# Patient Record
Sex: Female | Born: 1935 | Race: White | Hispanic: Yes | Smoking: Never smoker
Health system: Southern US, Community
[De-identification: ages and names within clinical notes are randomized; demographics above are authoritative.]

## PROBLEM LIST (undated history)

## (undated) DIAGNOSIS — Z8709 Personal history of other diseases of the respiratory system: Secondary | ICD-10-CM

## (undated) DIAGNOSIS — Z8781 Personal history of (healed) traumatic fracture: Secondary | ICD-10-CM

## (undated) DIAGNOSIS — R413 Other amnesia: Secondary | ICD-10-CM

## (undated) DIAGNOSIS — IMO0001 Reserved for inherently not codable concepts without codable children: Secondary | ICD-10-CM

## (undated) DIAGNOSIS — R030 Elevated blood-pressure reading, without diagnosis of hypertension: Secondary | ICD-10-CM

## (undated) DIAGNOSIS — IMO0002 Reserved for concepts with insufficient information to code with codable children: Secondary | ICD-10-CM

## (undated) HISTORY — DX: Other amnesia: R41.3

## (undated) HISTORY — DX: Reserved for concepts with insufficient information to code with codable children: IMO0002

## (undated) HISTORY — DX: Personal history of (healed) traumatic fracture: Z87.81

## (undated) HISTORY — PX: VAGINAL HYSTERECTOMY: SUR661

## (undated) HISTORY — DX: Personal history of other diseases of the respiratory system: Z87.09

## (undated) HISTORY — DX: Elevated blood-pressure reading, without diagnosis of hypertension: R03.0

## (undated) HISTORY — PX: BREAST SURGERY: SHX581

## (undated) HISTORY — DX: Reserved for inherently not codable concepts without codable children: IMO0001

---

## 2006-11-04 HISTORY — PX: TOTAL KNEE ARTHROPLASTY: SHX125

## 2010-06-28 ENCOUNTER — Emergency Department (HOSPITAL_BASED_OUTPATIENT_CLINIC_OR_DEPARTMENT_OTHER)
Admission: EM | Admit: 2010-06-28 | Discharge: 2010-06-28 | Payer: Self-pay | Source: Home / Self Care | Admitting: Emergency Medicine

## 2010-06-28 ENCOUNTER — Ambulatory Visit: Payer: Self-pay | Admitting: Diagnostic Radiology

## 2011-04-26 ENCOUNTER — Other Ambulatory Visit: Payer: Self-pay | Admitting: Neurology

## 2011-04-26 DIAGNOSIS — R413 Other amnesia: Secondary | ICD-10-CM

## 2013-09-03 ENCOUNTER — Ambulatory Visit (INDEPENDENT_AMBULATORY_CARE_PROVIDER_SITE_OTHER): Payer: Medicare Other | Admitting: Diagnostic Neuroimaging

## 2013-09-03 ENCOUNTER — Encounter: Payer: Self-pay | Admitting: Diagnostic Neuroimaging

## 2013-09-03 ENCOUNTER — Encounter (INDEPENDENT_AMBULATORY_CARE_PROVIDER_SITE_OTHER): Payer: Self-pay

## 2013-09-03 VITALS — BP 138/83 | HR 94 | Temp 98.2°F | Ht 63.0 in | Wt 124.5 lb

## 2013-09-03 DIAGNOSIS — R269 Unspecified abnormalities of gait and mobility: Secondary | ICD-10-CM

## 2013-09-03 DIAGNOSIS — R413 Other amnesia: Secondary | ICD-10-CM

## 2013-09-03 NOTE — Patient Instructions (Addendum)
I will order labs and MRI.  It is not safe for you to drive anymore. Please find other ways to get transportation (friends, family, taxi).

## 2013-09-03 NOTE — Progress Notes (Signed)
[facility]  PATIENT: Gloria Mcdaniel DOB: 09-04-1927  REFERRING CLINICIAN: Aguilar HISTORY FROM: patient and daughter REASON FOR VISIT: new consult   HISTORICAL  CHIEF COMPLAINT:  Chief Complaint  Patient presents with  . Memory Loss    HISTORY OF PRESENT ILLNESS:   79 year old right-handed female with hypothyroidism, breast cancer, here for evaluation of memory problems. Patient completed by her daughter. Her other daughter is a patient of mine, but not here for this visit.  For past one year patient has had progressive short-term memory problems. He forgets recent conversations and events. She's having more difficulty with driving, getting lost, driving the wrong way down the Street. Patient's daughter is concerned about her ability to drive. Patient herself takes care of her other daughter who has epilepsy.  Patient continues to take care of her activities daily living such as shopping, cooking, cleaning, finances.   REVIEW OF SYSTEMS: Full 14 system review of systems performed and notable only for memory loss confusion joint pain.  ALLERGIES: No Known Allergies  HOME MEDICATIONS: Outpatient Prescriptions Prior to Visit  Medication Sig Dispense Refill  . levothyroxine (SYNTHROID, LEVOTHROID) 25 MCG tablet Take 25 mcg by mouth daily before breakfast.      . oxyCODONE-acetaminophen (PERCOCET/ROXICET) 5-325 MG per tablet Take 1 tablet by mouth every 4 (four) hours as needed for severe pain.      . pregabalin (LYRICA) 50 MG capsule Take 50 mg by mouth 3 (three) times daily.      . ranitidine (ZANTAC) 150 MG tablet Take 150 mg by mouth 2 (two) times daily as needed.       . tamoxifen (NOLVADEX) 20 MG tablet Take 20 mg by mouth daily.      . calcium-vitamin D (OSCAL) 250-125 MG-UNIT per tablet Take 1 tablet by mouth daily.      . niacin 500 MG tablet Take 500 mg by mouth at bedtime.       No facility-administered medications prior to visit.    PAST  MEDICAL HISTORY: Past Medical History  Diagnosis Date  . Aftercare for healing traumatic fracture of other bone   . Genetic counseling     BRCA gnetic testing  . Elevated blood pressure reading without diagnosis of hypertension   . Personal history of other diseases of respiratory system   . Personal history of traumatic fracture   . Personal history of traumatic fracture   . Memory loss     PAST SURGICAL HISTORY: Past Surgical History  Procedure Laterality Date  . Breast surgery Right     Radical Mastectomy 1973-Dr. Joseph Art  . Vaginal hysterectomy    . Total knee arthroplasty Right 11/04/2006    FAMILY HISTORY: Family History  Problem Relation Age of Onset  . Cancer Father   . Seizures Daughter   . Thyroid disease Daughter     SOCIAL HISTORY:  History   Social History  . Marital Status: Widowed    Spouse Name: N/A    Number of Children: 3  . Years of Education: HS   Occupational History  . Retired    Social History Main Topics  . Smoking status: Never Smoker   . Smokeless tobacco: Never Used  . Alcohol Use: No  . Drug Use: No  . Sexual Activity: Not on file   Other Topics Concern  . Not on file   Social History Narrative   Patient lives at home with daughter.   Caffeine Use: 1 cup daily  PHYSICAL EXAM  Filed Vitals:   09/03/13 1105  BP: 138/83  Pulse: 94  Temp: 98.2 F (36.8 C)  TempSrc: Oral  Height: 5\' 3"  (1.6 m)  Weight: 124 lb 8 oz (56.473 kg)    Not recorded    Body mass index is 22.06 kg/(m^2).  GENERAL EXAM: Patient is in no distress; well developed, nourished and groomed; neck is supple  CARDIOVASCULAR: Regular rate and rhythm, no murmurs, no carotid bruits  NEUROLOGIC: MENTAL STATUS: awake, alert, oriented to person, place and time, recent and remote memory intact, normal attention and concentration, language fluent, comprehension intact, naming intact, fund of knowledge appropriate; MMSE 21/30 (WITH USING WORLD BACKWARDS,  SHE SCORES 25/30). NO FRONTAL RELEASE SIGNS. CRANIAL NERVE: no papilledema on fundoscopic exam, pupils equal and reactive to light, visual fields full to confrontation, extraocular muscles intact, no nystagmus, facial sensation and strength symmetric, hearing intact, palate elevates symmetrically, uvula midline, shoulder shrug symmetric, tongue midline. MOTOR: normal bulk and tone, full strength in the BUE, BLE; EXCEPT LIMITED (4/5) IN RLE DUE TO RIGHT KNEE PAIN SENSORY: normal and symmetric to light touch, temperature; ABSENT VIB AT TOES. COORDINATION: finger-nose-finger, fine finger movements normal REFLEXES: deep tendon reflexes present and symmetric GAIT/STATION: SLOW TO RISE; CAUTIOUS GAIT; STOOPED POSTURE. UNSTEADY, SLOW GAIT. CANNOT TANDEM, TOE OR HEEL. ROMBERG NEG.    DIAGNOSTIC DATA (LABS, IMAGING, TESTING) - I reviewed patient records, labs, notes, testing and imaging myself where available.  No results found for this basename: WBC, HGB, HCT, MCV, PLT   No results found for this basename: na, k, cl, co2, glucose, bun, creatinine, calcium, prot, albumin, ast, alt, alkphos, bilitot, gfrnonaa, gfraa   No results found for this basename: CHOL, HDL, LDLCALC, LDLDIRECT, TRIG, CHOLHDL   No results found for this basename: HGBA1C   No results found for this basename: VITAMINB12   No results found for this basename: TSH     ASSESSMENT AND PLAN  79 y.o. year old female here with memory loss x 1 year. Mainly short term. Poor insight. Continues to drive, although daughters are afraid of what has been happening (getting lost, wrong way on 1 way).  Ddx: mild dementia, MCI, metabolic, structural, vascular  PLAN: - [place] brain [place]) - labs (ask PCP to check B12, TSH) - stop driving (has been getting lost; went wrong way on 1 way street; crossed over median) - home health evaluation (memory loss, gait diff, balance) - patient and family need to assess long  term living situation and safety of living independently   Orders Placed This Encounter  Procedures  . MR Brain Wo Contrast  . Home Health  . Face-to-face encounter (required for Medicare/Medicaid patients)    Return in about 3 months (around 12/02/2013) for with Heide Guile or Penumalli.    Suanne Marker, MD 09/03/2013, 1:05 PM Certified in Neurology, Neurophysiology and Neuroimaging  [facility] [address], [address] [place], [place] [address] [phone]

## 2013-09-11 ENCOUNTER — Ambulatory Visit (HOSPITAL_BASED_OUTPATIENT_CLINIC_OR_DEPARTMENT_OTHER)
Admission: RE | Admit: 2013-09-11 | Discharge: 2013-09-11 | Disposition: A | Discharge status: Home / Self Care | Payer: Medicare Other | Source: Ambulatory Visit | Attending: Diagnostic Neuroimaging | Admitting: Diagnostic Neuroimaging

## 2013-09-11 DIAGNOSIS — R413 Other amnesia: Secondary | ICD-10-CM

## 2013-09-11 DIAGNOSIS — R269 Unspecified abnormalities of gait and mobility: Secondary | ICD-10-CM

## 2013-09-11 DIAGNOSIS — G319 Degenerative disease of nervous system, unspecified: Secondary | ICD-10-CM | POA: Insufficient documentation

## 2013-09-20 ENCOUNTER — Telehealth: Payer: Self-pay | Admitting: Diagnostic Neuroimaging

## 2013-09-20 NOTE — Telephone Encounter (Signed)
Please advise, patient's daughter wants the MRI results.

## 2013-09-20 NOTE — Telephone Encounter (Signed)
Patient's daughter calling to state that patient has not heard anything about her MRI results. Please call the patient's daughter.

## 2013-09-21 ENCOUNTER — Telehealth: Payer: Self-pay | Admitting: Diagnostic Neuroimaging

## 2013-09-21 NOTE — Telephone Encounter (Signed)
Patient's daughter called x2 requesting results of MRI done on 09/11/13. Please call to advise.

## 2013-09-27 NOTE — Telephone Encounter (Signed)
Patient's daughter calling again for MRI results. Patient's daughter has called multiple times, please call the patient.

## 2013-09-27 NOTE — Telephone Encounter (Signed)
Please advise 

## 2013-09-27 NOTE — Telephone Encounter (Signed)
I called pts daughter and gave MRI results. Dx for patient is MCI vs mild dementia. Will need more supervision. Pt needs to stop driving.  Will proceed with home health referral with social work assistance. Order already placed at last visit.   May consider donepezil in next 1-2 months as well.  Suanne MarkerVIKRAM R. PENUMALLI, MD 09/27/2013, 5:48 PM Certified in Neurology, Neurophysiology and Neuroimaging  [facility] [address], [address] [place], [place] [address] [phone]

## 2013-09-28 NOTE — Telephone Encounter (Signed)
Home health referral 

## 2013-10-19 ENCOUNTER — Other Ambulatory Visit: Payer: Self-pay | Admitting: Diagnostic Neuroimaging

## 2013-10-19 MED ORDER — DONEPEZIL HCL 5 MG PO TABS: 5.0000 mg | ORAL_TABLET | Freq: Every day | ORAL | Status: DC

## 2013-12-15 ENCOUNTER — Ambulatory Visit: Payer: PRIVATE HEALTH INSURANCE | Admitting: Diagnostic Neuroimaging

## 2014-11-01 ENCOUNTER — Encounter: Payer: Self-pay | Admitting: Diagnostic Neuroimaging

## 2014-11-01 ENCOUNTER — Ambulatory Visit (INDEPENDENT_AMBULATORY_CARE_PROVIDER_SITE_OTHER): Payer: Medicare Other | Admitting: Diagnostic Neuroimaging

## 2014-11-01 VITALS — BP 140/77 | HR 101 | Ht 61.0 in | Wt 151.2 lb

## 2014-11-01 DIAGNOSIS — M25511 Pain in right shoulder: Secondary | ICD-10-CM

## 2014-11-01 DIAGNOSIS — F03B0 Unspecified dementia, moderate, without behavioral disturbance, psychotic disturbance, mood disturbance, and anxiety: Secondary | ICD-10-CM

## 2014-11-01 DIAGNOSIS — H53461 Homonymous bilateral field defects, right side: Secondary | ICD-10-CM

## 2014-11-01 DIAGNOSIS — F0390 Unspecified dementia without behavioral disturbance: Secondary | ICD-10-CM

## 2014-11-01 MED ORDER — MEMANTINE HCL 10 MG PO TABS: 10.0000 mg | ORAL_TABLET | Freq: Two times a day (BID) | ORAL | Status: DC

## 2014-11-01 NOTE — Progress Notes (Signed)
[facility]  PATIENT: Gloria Mcdaniel DOB: 09/12/1927  REFERRING CLINICIAN: Aguilar HISTORY FROM: patient and daughter REASON FOR VISIT: follow up   HISTORICAL  CHIEF COMPLAINT:  Chief Complaint  Patient presents with  . Follow-up    memory loss; had a stroke per eye Dr. about a year ago    HISTORY OF PRESENT ILLNESS:   [place] 11/01/14: Since last visit, memory loss has continued. In Jan 2015, patient's daughter took over finances. In Feb 2015, she fell and broke left hip. Then transitioned to rehab then [facility] independent living. Also, in Jan 2016 she went to eye doctor, found to have incidental RIGHT homonymous hemianopsia. Patient doesn't recall any focal neuro deficits.   PRIOR HPI (09/03/13): 81 year old right-handed female with hypothyroidism, breast cancer, here for evaluation of memory problems. Patient completed by her daughter. Her other daughter is a patient of mine, but not here for this visit. For past one year patient has had progressive short-term memory problems. He forgets recent conversations and events. She's having more difficulty with driving, getting lost, driving the wrong way down the Street. Patient's daughter is concerned about her ability to drive. Patient herself takes care of her other daughter who has epilepsy. Patient continues to take care of her activities daily living such as shopping, cooking, cleaning, finances.   REVIEW OF SYSTEMS: Full 14 system review of systems performed and notable only for freq urination joint pain joint swelling aching muscles walking diff memory loss daytime sleepiness.   ALLERGIES: No Known Allergies  HOME MEDICATIONS: Outpatient Prescriptions Prior to Visit  Medication Sig Dispense Refill  . levothyroxine (SYNTHROID, LEVOTHROID) 25 MCG tablet Take 25 mcg by mouth daily before breakfast.    . tamoxifen (NOLVADEX) 20 MG tablet Take 20 mg by mouth daily.    . Diclofenac Sodium (VOLTAREN EX)  Apply 1 application topically daily.    Marland Kitchen donepezil (ARICEPT) 5 MG tablet Take 1 tablet (5 mg total) by mouth at bedtime. 90 tablet 4  . oxyCODONE-acetaminophen (PERCOCET/ROXICET) 5-325 MG per tablet Take 1 tablet by mouth every 4 (four) hours as needed for severe pain.    . pregabalin (LYRICA) 50 MG capsule Take 50 mg by mouth 3 (three) times daily.    . ranitidine (ZANTAC) 150 MG tablet Take 150 mg by mouth 2 (two) times daily as needed.      No facility-administered medications prior to visit.    PAST MEDICAL HISTORY: Past Medical History  Diagnosis Date  . Aftercare for healing traumatic fracture of other bone   . Genetic counseling     BRCA gnetic testing  . Elevated blood pressure reading without diagnosis of hypertension   . Personal history of other diseases of respiratory system   . Personal history of traumatic fracture   . Personal history of traumatic fracture   . Memory loss     PAST SURGICAL HISTORY: Past Surgical History  Procedure Laterality Date  . Breast surgery Right     Radical Mastectomy 1973-Dr. Sherral Hammers  . Vaginal hysterectomy    . Total knee arthroplasty Right 11/04/2006    FAMILY HISTORY: Family History  Problem Relation Age of Onset  . Cancer Father   . Seizures Daughter   . Thyroid disease Daughter     SOCIAL HISTORY:  History   Social History  . Marital Status: Widowed    Spouse Name: N/A    Number of Children: 3  . Years of Education: HS   Occupational History  .  Retired    Social History Main Topics  . Smoking status: Never Smoker   . Smokeless tobacco: Never Used  . Alcohol Use: No  . Drug Use: No  . Sexual Activity: Not on file   Other Topics Concern  . Not on file   Social History Narrative   Patient lives at home with daughter as [facility]   Caffeine Use: 1 cup daily     PHYSICAL EXAM  Filed Vitals:   11/01/14 1325  BP: 140/77  Pulse: 101  Height: $Remove'5\' 1"'kilffjW$  (1.549 m)  Weight: 151 lb 3.2 oz  (68.584 kg)    Not recorded     MMSE - Mini Mental State Exam 11/01/2014  Orientation to time 1  Orientation to Place 3  Registration 3  Attention/ Calculation 1  Recall 0  Language- name 2 objects 2  Language- repeat 1  Language- follow 3 step command 3  Language- read & follow direction 0  Write a sentence 1  Copy design 1  Total score 16     Body mass index is 28.58 kg/(m^2).  GENERAL EXAM: Patient is in no distress; well developed, nourished and groomed; neck is supple  CARDIOVASCULAR: Regular rate and rhythm, no murmurs, no carotid bruits  NEUROLOGIC: MENTAL STATUS: awake, alert, language fluent, comprehension intact, naming intact, fund of knowledge appropriate; MMSE16/30; NO FRONTAL RELEASE SIGNS. CRANIAL NERVE: no papilledema on fundoscopic exam, pupils equal and reactive to light, RIGHT HOMONYMOUS HEMIANOPSIA.  extraocular muscles intact, no nystagmus, facial sensation and strength symmetric, hearing intact, palate elevates symmetrically, uvula midline, shoulder shrug symmetric, tongue midline. MOTOR: normal bulk and tone, full strength in the BUE, BLE; EXCEPT LIMITED (4/5) IN RLE DUE TO RIGHT KNEE PAIN AND RUE PROX  DUE TO RIGHT SHOULDER PAIN SENSORY: normal and symmetric to light touch, temperature; ABSENT VIB AT TOES. COORDINATION: finger-nose-finger, fine finger movements normal REFLEXES: deep tendon reflexes present and symmetric GAIT/STATION: SLOW TO RISE; CAUTIOUS GAIT; STOOPED POSTURE. UNSTEADY, SLOW GAIT. USES WALKER.     DIAGNOSTIC DATA (LABS, IMAGING, TESTING) - I reviewed patient records, labs, notes, testing and imaging myself where available.  No results found for: WBC No results found for: NA No results found for: CHOL No results found for: HGBA1C No results found for: VITAMINB12 No results found for: TSH   ASSESSMENT AND PLAN  81 y.o. year old female here with memory loss since 2014, mainly short term with poor insight. Now with new right  visual field cut and progression of memory loss.  Dx: moderate dementia   PLAN: - MRI brain (eval for left PCA stroke or other cause of vision change); then may consider stroke workup and risk factor reduction pending MRI results - add memantine $RemoveBefor'10mg'qiJygmCPCcin$  BID to donepezil - PT eval for right shoulder pain  Orders Placed This Encounter  Procedures  . MR Brain Wo Contrast  . Ambulatory referral to Physical Therapy   Meds ordered this encounter  Medications  . memantine (NAMENDA) 10 MG tablet    Sig: Take 1 tablet (10 mg total) by mouth 2 (two) times daily.    Dispense:  60 tablet    Refill:  12   Return in about 4 months (around 03/02/2015).    Penni Bombard, MD 5/0/9326, 7:12 PM Certified in Neurology, Neurophysiology and Neuroimaging  [facility] [address] [place], [place] [address] [phone]

## 2014-11-01 NOTE — Patient Instructions (Signed)
I will check MRI brain.   Start memantine 10mg  at bedtime x 1 week; then increase to twice a day.

## 2014-11-16 ENCOUNTER — Inpatient Hospital Stay: Admission: RE | Admit: 2014-11-16 | Payer: Medicare Other | Source: Ambulatory Visit

## 2014-11-28 ENCOUNTER — Ambulatory Visit
Admission: RE | Admit: 2014-11-28 | Discharge: 2014-11-28 | Disposition: A | Discharge status: Home / Self Care | Payer: Medicare Other | Source: Ambulatory Visit | Attending: Diagnostic Neuroimaging | Admitting: Diagnostic Neuroimaging

## 2014-11-28 DIAGNOSIS — H53461 Homonymous bilateral field defects, right side: Secondary | ICD-10-CM

## 2014-11-28 DIAGNOSIS — F0390 Unspecified dementia without behavioral disturbance: Secondary | ICD-10-CM

## 2014-11-28 DIAGNOSIS — M25511 Pain in right shoulder: Secondary | ICD-10-CM

## 2014-11-28 DIAGNOSIS — F03B0 Unspecified dementia, moderate, without behavioral disturbance, psychotic disturbance, mood disturbance, and anxiety: Secondary | ICD-10-CM

## 2014-12-07 ENCOUNTER — Telehealth: Payer: Self-pay | Admitting: Diagnostic Neuroimaging

## 2014-12-07 NOTE — Telephone Encounter (Signed)
Patient's daughter is calling to get MRI results for patient. Please call.

## 2014-12-12 ENCOUNTER — Telehealth: Payer: Self-pay | Admitting: Diagnostic Neuroimaging

## 2014-12-12 DIAGNOSIS — I63532 Cerebral infarction due to unspecified occlusion or stenosis of left posterior cerebral artery: Secondary | ICD-10-CM

## 2014-12-12 NOTE — Telephone Encounter (Signed)
Patient's daughter is calling to get the results of her Mother's MRI.  Please call.

## 2014-12-16 NOTE — Telephone Encounter (Signed)
I called pts daughter and indicated that pt may have had hemorrhagic infarct in Jan 2016 or earlier. Will repeat CT head in April to ensure stability/resolution. No other further aggressive work up / interventions at this time due to advanced age, dementia and medical co-morbidities.   Suanne MarkerVIKRAM R. PENUMALLI, MD 12/16/2014, 4:45 PM Certified in Neurology, Neurophysiology and Neuroimaging  [facility] [address], [address] [place], [place] [address] [phone]

## 2014-12-28 ENCOUNTER — Ambulatory Visit
Admission: RE | Admit: 2014-12-28 | Discharge: 2014-12-28 | Disposition: A | Discharge status: Home / Self Care | Payer: Medicare Other | Source: Ambulatory Visit | Attending: Diagnostic Neuroimaging | Admitting: Diagnostic Neuroimaging

## 2014-12-28 DIAGNOSIS — I63532 Cerebral infarction due to unspecified occlusion or stenosis of left posterior cerebral artery: Secondary | ICD-10-CM | POA: Diagnosis not present

## 2015-01-04 ENCOUNTER — Telehealth: Payer: Self-pay | Admitting: Diagnostic Neuroimaging

## 2015-01-04 NOTE — Telephone Encounter (Signed)
Patient's daughter Earley AbideHilda requesting CT scan results.  Please call and advise.

## 2015-01-04 NOTE — Telephone Encounter (Signed)
Called and spoke with the daughter and explained, per Dr. Richrd HumblesPenumalli's instructions, about the head CT. Dr. Demetrius CharityP asked me to tell the daughter that the CT showed that the hemorrhage was resolving at this time and that nothing else was an issue. The daughter was very pleased and stated a thanks and an understanding.

## 2015-01-25 ENCOUNTER — Other Ambulatory Visit: Payer: Self-pay | Admitting: Diagnostic Neuroimaging

## 2015-02-28 ENCOUNTER — Ambulatory Visit: Payer: Medicare Other | Admitting: Diagnostic Neuroimaging

## 2015-03-15 ENCOUNTER — Encounter: Payer: Self-pay | Admitting: Diagnostic Neuroimaging

## 2015-03-15 ENCOUNTER — Ambulatory Visit (INDEPENDENT_AMBULATORY_CARE_PROVIDER_SITE_OTHER): Payer: Medicare Other | Admitting: Diagnostic Neuroimaging

## 2015-03-15 VITALS — BP 107/64 | HR 88 | Wt 154.3 lb

## 2015-03-15 DIAGNOSIS — I63532 Cerebral infarction due to unspecified occlusion or stenosis of left posterior cerebral artery: Secondary | ICD-10-CM

## 2015-03-15 DIAGNOSIS — F0390 Unspecified dementia without behavioral disturbance: Secondary | ICD-10-CM | POA: Diagnosis not present

## 2015-03-15 DIAGNOSIS — F03B0 Unspecified dementia, moderate, without behavioral disturbance, psychotic disturbance, mood disturbance, and anxiety: Secondary | ICD-10-CM

## 2015-03-15 MED ORDER — DONEPEZIL HCL 10 MG PO TABS: 10.0000 mg | ORAL_TABLET | Freq: Every day | ORAL | Status: DC

## 2015-03-15 MED ORDER — MEMANTINE HCL 10 MG PO TABS: 10.0000 mg | ORAL_TABLET | Freq: Two times a day (BID) | ORAL | Status: DC

## 2015-03-15 NOTE — Patient Instructions (Signed)
Continue current medications. 

## 2015-03-15 NOTE — Progress Notes (Signed)
[facility]  PATIENT: Gloria Mcdaniel DOB: May 06, 1927  REFERRING CLINICIAN: Aguilar HISTORY FROM: patient and daughter REASON FOR VISIT: follow up   HISTORICAL  CHIEF COMPLAINT:  Chief Complaint  Patient presents with  . Dementia    rm 7, daughter  . Memory Loss    MMSE 20  . Follow-up    HISTORY OF PRESENT ILLNESS:   UPDATE 03/15/15: Since last visit, doing well. No new events. Feels good. Memory stable. MRI and CT head results reviewed again.  UPDATE 11/01/14: Since last visit, memory loss has continued. In Jan 2015, patient's daughter took over finances. In Feb 2015, she fell and broke left hip. Then transitioned to rehab then [facility]. Also, in Jan 2016 she went to eye doctor, found to have incidental RIGHT homonymous hemianopsia. Patient doesn't recall any focal neuro deficits.   PRIOR HPI (09/03/13): 81 year old right-handed female with hypothyroidism, breast cancer, here for evaluation of memory problems. Patient completed by her daughter. Her other daughter is a patient of mine, but not here for this visit. For past one year patient has had progressive short-term memory problems. He forgets recent conversations and events. She's having more difficulty with driving, getting lost, driving the wrong way down the Street. Patient's daughter is concerned about her ability to drive. Patient herself takes care of her other daughter who has epilepsy. Patient continues to take care of her activities daily living such as shopping, cooking, cleaning, finances.   REVIEW OF SYSTEMS: Full 14 system review of systems performed and notable only for freq urination joint pain joint swelling aching muscles walking diff memory loss.   ALLERGIES: No Known Allergies  HOME MEDICATIONS: Outpatient Prescriptions Prior to Visit  Medication Sig Dispense Refill  . acetaminophen (TYLENOL) 325 MG tablet     . levothyroxine (SYNTHROID, LEVOTHROID) 25  MCG tablet Take 25 mcg by mouth daily before breakfast.    . Melatonin 3 MG TABS Take by mouth.    . sertraline (ZOLOFT) 25 MG tablet     . tamoxifen (NOLVADEX) 20 MG tablet Take 20 mg by mouth daily.    Marland Kitchen donepezil (ARICEPT) 10 MG tablet TAKE 1 TABLET BY MOUTH AT BEDTIME 30 tablet 11  . memantine (NAMENDA) 10 MG tablet Take 1 tablet (10 mg total) by mouth 2 (two) times daily. 60 tablet 12  . oxybutynin (DITROPAN-XL) 5 MG 24 hr tablet      No facility-administered medications prior to visit.    PAST MEDICAL HISTORY: Past Medical History  Diagnosis Date  . Aftercare for healing traumatic fracture of other bone   . Genetic counseling     BRCA gnetic testing  . Elevated blood pressure reading without diagnosis of hypertension   . Personal history of other diseases of respiratory system   . Personal history of traumatic fracture   . Personal history of traumatic fracture   . Memory loss     PAST SURGICAL HISTORY: Past Surgical History  Procedure Laterality Date  . Breast surgery Right     Radical Mastectomy 1973-Dr. Sherral Hammers  . Vaginal hysterectomy    . Total knee arthroplasty Right 11/04/2006    FAMILY HISTORY: Family History  Problem Relation Age of Onset  . Cancer Father   . Seizures Daughter   . Thyroid disease Daughter     SOCIAL HISTORY:  History   Social History  . Marital Status: Widowed    Spouse Name: N/A  . Number of Children: 3  . Years of  Education: HS   Occupational History  . Retired    Social History Main Topics  . Smoking status: Never Smoker   . Smokeless tobacco: Never Used  . Alcohol Use: No  . Drug Use: No  . Sexual Activity: Not on file   Other Topics Concern  . Not on file   Social History Narrative   Patient lives at home with daughter as Alfredo Bach on [facility]   Caffeine Use: 1 cup daily     PHYSICAL EXAM  Filed Vitals:   03/15/15 1350  BP: 107/64  Pulse: 88  Weight: 154 lb 4.8 oz (69.99 kg)    Not recorded       MMSE - Mini Mental State Exam 03/15/2015 11/01/2014  Orientation to time 1 1  Orientation to Place 4 3  Registration 3 3  Attention/ Calculation 2 1  Recall 0 0  Language- name 2 objects 2 2  Language- repeat 0 1  Language- follow 3 step command 3 3  Language- read & follow direction 1 0  Write a sentence 1 1  Copy design 0 1  Total score 17 16     Body mass index is 29.17 kg/(m^2).  GENERAL EXAM: Patient is in no distress; well developed, nourished and groomed; neck is supple  CARDIOVASCULAR: Regular rate and rhythm, no murmurs, no carotid bruits  NEUROLOGIC: MENTAL STATUS: awake, alert, language fluent, comprehension intact, naming intact, fund of knowledge appropriate; NO FRONTAL RELEASE SIGNS. CRANIAL NERVE: pupils equal and reactive to light, RIGHT HOMONYMOUS HEMIANOPSIA.  extraocular muscles intact, no nystagmus, facial sensation and strength symmetric, hearing intact, palate elevates symmetrically, uvula midline, shoulder shrug symmetric, tongue midline. MOTOR: normal bulk and tone, full strength in the BUE, BLE; EXCEPT LIMITED IN BLE (4) SENSORY: normal and symmetric to light touch, temperature; ABSENT VIB AT TOES. COORDINATION: finger-nose-finger, fine finger movements normal REFLEXES: deep tendon reflexes present and symmetric GAIT/STATION: SLOW TO RISE; CAUTIOUS GAIT; STOOPED POSTURE. UNSTEADY, SLOW GAIT. USES WALKER.     DIAGNOSTIC DATA (LABS, IMAGING, TESTING) - I reviewed patient records, labs, notes, testing and imaging myself where available.  No results found for: WBC No results found for: NA No results found for: CHOL No results found for: HGBA1C No results found for: VITAMINB12 No results found for: TSH   11/28/14 MRI brain [I reviewed images myself and agree with interpretation. -VRP]  - left occipital lobe focus with mixed subacute and chronic heme products. This could represent a hemorrhagic infarct or hemorrhagic tumor. It is less likely to  represent a vascular malformation as it was not present in 2014. Additionally, there are extensive white matter changes that have slightly progressed when compared to the MRI dated 09/11/2013.  12/29/14 CT head [I reviewed images myself and agree with interpretation. -VRP]  1. Left occipital lesion with encephalomalacia, measuring 1.3x1.2 cm, has decreased in size compared to MRI on 11/28/14. No residual hyperdensity in this lesion. No evidence of expansion or growth. Most likely represents resolving lobar hemorrhage, such as can be seen with amyloid angiopathy, or hemorrhagic infarct. 2. Severe periventricular and subcortical chronic small vessel ischemic disease.  3. No acute findings.   ASSESSMENT AND PLAN  81 y.o. year old female here with memory loss since 2014, mainly short term with poor insight. 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  Dx: moderate dementia (stable) + hemorrhagic infarct/lobar hemorrhage (stable/resolving)  PLAN: I spent 15 minutes of face to face time with patient. Greater than 50% of time was spent in counseling and coordination of care with patient. In summary we discussed:  - continue memantine 70m BID + donepezil 164mqhs - re: hemorrhagic infarct --> stabel, and no other further aggressive work up / interventions at this time due to advanced age, dementia and medical co-morbidities. May have been related to unlying neurodegenerative pathology (? Amyloid based pathology / Alzheimer's dz) - patient may follow up as needed if PCP agrees to refill dementia meds; then we may see her as needed   Meds ordered this encounter  Medications  . donepezil (ARICEPT) 10 MG tablet    Sig: Take 1 tablet (10 mg total) by mouth at bedtime.    Dispense:  90 tablet    Refill:  4  . memantine (NAMENDA) 10 MG tablet    Sig: Take 1 tablet (10 mg total) by mouth 2 (two) times  daily.    Dispense:  180 tablet    Refill:  4   Return in about 1 year (around 03/14/2016).    VIPenni BombardMD 6/0/45/91362:8:59M Certified in Neurology, Neurophysiology and Neuroimaging  [facility] [address]SuWest Haven-Sylvan[place] [phone]

## 2015-07-20 NOTE — Telephone Encounter (Signed)
Error

## 2016-03-19 ENCOUNTER — Other Ambulatory Visit: Payer: Self-pay | Admitting: Diagnostic Neuroimaging

## 2016-03-20 ENCOUNTER — Encounter: Payer: Self-pay | Admitting: Diagnostic Neuroimaging

## 2016-03-20 ENCOUNTER — Ambulatory Visit (INDEPENDENT_AMBULATORY_CARE_PROVIDER_SITE_OTHER): Payer: Medicare Other | Admitting: Diagnostic Neuroimaging

## 2016-03-20 VITALS — BP 125/64 | HR 72 | Ht 61.0 in | Wt 147.4 lb

## 2016-03-20 DIAGNOSIS — F0390 Unspecified dementia without behavioral disturbance: Secondary | ICD-10-CM

## 2016-03-20 DIAGNOSIS — I63532 Cerebral infarction due to unspecified occlusion or stenosis of left posterior cerebral artery: Secondary | ICD-10-CM

## 2016-03-20 DIAGNOSIS — F03B0 Unspecified dementia, moderate, without behavioral disturbance, psychotic disturbance, mood disturbance, and anxiety: Secondary | ICD-10-CM

## 2016-03-20 MED ORDER — MEMANTINE HCL 10 MG PO TABS: 10.0000 mg | ORAL_TABLET | Freq: Two times a day (BID) | ORAL | Status: AC

## 2016-03-20 MED ORDER — DONEPEZIL HCL 10 MG PO TABS: 10.0000 mg | ORAL_TABLET | Freq: Every day | ORAL | Status: AC

## 2016-03-20 MED ORDER — SERTRALINE HCL 25 MG PO TABS: 25.0000 mg | ORAL_TABLET | Freq: Every day | ORAL | Status: AC

## 2016-03-20 NOTE — Progress Notes (Signed)
[facility]  PATIENT: Gloria Mcdaniel DOB: 18-Nov-1926  REFERRING CLINICIAN: Aguilar HISTORY FROM: patient and daughter REASON FOR VISIT: follow up   HISTORICAL  CHIEF COMPLAINT:  Chief Complaint  Patient presents with  . Dementia    rm 7, dgtrLenell Antu, MMSE 9  . Follow-up    1 year    HISTORY OF PRESENT ILLNESS:   UPDATE 03/15/15: Since last visit, doing well. No new events. Feels good. Memory stable. MRI and CT head results reviewed again.  UPDATE 11/01/14: Since last visit, memory loss has continued. In Jan 2015, patient's daughter took over finances. In Feb 2015, she fell and broke left hip. Then transitioned to rehab then [facility]. Also, in Jan 2016 she went to eye doctor, found to have incidental RIGHT homonymous hemianopsia. Patient doesn't recall any focal neuro deficits.   PRIOR HPI (09/03/13): 82 year old right-handed female with hypothyroidism, breast cancer, here for evaluation of memory problems. Patient completed by her daughter. Her other daughter is a patient of mine, but not here for this visit. For past one year patient has had progressive short-term memory problems. He forgets recent conversations and events. She's having more difficulty with driving, getting lost, driving the wrong way down the Street. Patient's daughter is concerned about her ability to drive. Patient herself takes care of her other daughter who has epilepsy. Patient continues to take care of her activities daily living such as shopping, cooking, cleaning, finances.   REVIEW OF SYSTEMS: Full 14 system review of systems performed and notable only for freq urination joint pain joint swelling aching muscles walking diff memory loss.   ALLERGIES: No Known Allergies  HOME MEDICATIONS: Outpatient Prescriptions Prior to Visit  Medication Sig Dispense Refill  . acetaminophen (TYLENOL) 325 MG tablet     . levothyroxine (SYNTHROID, LEVOTHROID) 25 MCG  tablet Take 25 mcg by mouth daily before breakfast.    . tamoxifen (NOLVADEX) 20 MG tablet Take 20 mg by mouth daily.    Marland Kitchen donepezil (ARICEPT) 10 MG tablet Take 1 tablet (10 mg total) by mouth at bedtime. 90 tablet 4  . memantine (NAMENDA) 10 MG tablet Take 1 tablet (10 mg total) by mouth 2 (two) times daily. 180 tablet 4  . sertraline (ZOLOFT) 25 MG tablet     . Melatonin 3 MG TABS Take by mouth.     No facility-administered medications prior to visit.    PAST MEDICAL HISTORY: Past Medical History  Diagnosis Date  . Aftercare for healing traumatic fracture of other bone   . Genetic counseling     BRCA gnetic testing  . Elevated blood pressure reading without diagnosis of hypertension   . Personal history of other diseases of respiratory system   . Personal history of traumatic fracture   . Personal history of traumatic fracture   . Memory loss     PAST SURGICAL HISTORY: Past Surgical History  Procedure Laterality Date  . Breast surgery Right     Radical Mastectomy 1973-Dr. Sherral Hammers  . Vaginal hysterectomy    . Total knee arthroplasty Right 11/04/2006    FAMILY HISTORY: Family History  Problem Relation Age of Onset  . Cancer Father   . Seizures Daughter   . Thyroid disease Daughter     SOCIAL HISTORY:  Social History   Social History  . Marital Status: Widowed    Spouse Name: N/A  . Number of Children: 3  . Years of Education: HS   Occupational History  . Retired  Social History Main Topics  . Smoking status: Never Smoker   . Smokeless tobacco: Never Used  . Alcohol Use: No  . Drug Use: No  . Sexual Activity: Not on file   Other Topics Concern  . Not on file   Social History Narrative   Patient lives at home with daughter as Alfredo [facility] on [facility]   Caffeine Use: 1 cup daily     PHYSICAL EXAM  Filed Vitals:   03/20/16 1340  BP: 125/64  Pulse: 72  Height: '5\' 1"'  (1.549 m)  Weight: 147 lb 6.4 oz (66.86 kg)    Not recorded      MMSE - Mini Mental State Exam 03/20/2016 03/15/2015 11/01/2014  Orientation to time 0 1 1  Orientation to Place 0 4 3  Registration '1 3 3  ' Attention/ Calculation '2 2 1  ' Recall 0 0 0  Language- name 2 objects '2 2 2  ' Language- repeat 0 0 1  Language- follow 3 step command '2 3 3  ' Language- follow 3 step command-comments folded twice - -  Language- read & follow direction 1 1 0  Write a sentence '1 1 1  ' Copy design 0 0 1  Total score '9 17 16     ' Body mass index is 27.87 kg/(m^2).  GENERAL EXAM: Patient is in no distress; well developed, nourished and groomed; neck is supple  CARDIOVASCULAR: Regular rate and rhythm, no murmurs, no carotid bruits  NEUROLOGIC: MENTAL STATUS: PLEASANT, SMILING, awake, alert, DECR FLUENCY, comprehension intact, naming intact, fund of knowledge appropriate; NO FRONTAL RELEASE SIGNS. CRANIAL NERVE: pupils equal and reactive to light, visual field full to confrontation, extraocular muscles intact, no nystagmus, facial sensation and strength symmetric, hearing intact, palate elevates symmetrically, uvula midline, shoulder shrug symmetric, tongue midline. MOTOR: normal bulk and tone, full strength in the BUE, BLE; EXCEPT LIMITED IN BLE (4) SENSORY: normal and symmetric to light touch, temperature; ABSENT VIB AT TOES. COORDINATION: finger-nose-finger, fine finger movements normal REFLEXES: deep tendon reflexes present and symmetric GAIT/STATION: SLOW TO RISE; CAUTIOUS GAIT; STOOPED POSTURE. UNSTEADY, SLOW GAIT. USES WALKER.     DIAGNOSTIC DATA (LABS, IMAGING, TESTING) - I reviewed patient records, labs, notes, testing and imaging myself where available.  No results found for: WBC No results found for: NA No results found for: CHOL No results found for: HGBA1C No results found for: VITAMINB12 No results found for: TSH   11/28/14 MRI brain [I reviewed images myself and agree with interpretation. -VRP]  - left occipital lobe focus with mixed subacute and  chronic heme products. This could represent a hemorrhagic infarct or hemorrhagic tumor. It is less likely to represent a vascular malformation as it was not present in 2014. Additionally, there are extensive white matter changes that have slightly progressed when compared to the MRI dated 09/11/2013.  12/29/14 CT head [I reviewed images myself and agree with interpretation. -VRP]  1. Left occipital lesion with encephalomalacia, measuring 1.3x1.2 cm, has decreased in size compared to MRI on 11/28/14. No residual hyperdensity in this lesion. No evidence of expansion or growth. Most likely represents resolving lobar hemorrhage, such as can be seen with amyloid angiopathy, or hemorrhagic infarct. 2. Severe periventricular and subcortical chronic small vessel ischemic disease.  3. No acute findings.   ASSESSMENT AND PLAN  82 y.o. year old female here with memory loss since 2014, mainly short term with poor insight. Also with new right visual field cut and progression of memory loss in early 2016 -->  hemorrhagic left occipital lobe infarct vs lobar hemorrhage.  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 May have been related to underlying neurodegenerative pathology (? Amyloid based pathology / Alzheimer's dz) - patient may follow up as needed if PCP agrees to refill dementia meds; then we may see her as needed  Meds ordered this encounter  Medications  . donepezil (ARICEPT) 10 MG tablet    Sig: Take 1 tablet (10 mg total) by mouth at bedtime.    Dispense:  90 tablet    Refill:  4  . memantine  (NAMENDA) 10 MG tablet    Sig: Take 1 tablet (10 mg total) by mouth 2 (two) times daily.    Dispense:  180 tablet    Refill:  4  . sertraline (ZOLOFT) 25 MG tablet    Sig: Take 1 tablet (25 mg total) by mouth daily.    Dispense:  90 tablet    Refill:  4   Return in about 1 year (around 03/20/2017).    VIPenni BombardMD 02/21/57/30942:0:76M Certified in Neurology, Neurophysiology and Neuroimaging  [facility] [address] [id number]

## 2016-03-20 NOTE — Patient Instructions (Signed)
-  continue current medications

## 2016-03-24 ENCOUNTER — Other Ambulatory Visit: Payer: Self-pay | Admitting: Diagnostic Neuroimaging

## 2016-09-26 ENCOUNTER — Telehealth: Payer: Self-pay | Admitting: *Deleted

## 2016-09-26 NOTE — Telephone Encounter (Signed)
Spoke with patient's daughter, [name] to r/s patient's one year follow up due to provider being out of the office. She stated she did not have her calendar. She stated she will call back and reschedule her mother's follow up.  She verbalized understanding of call.

## 2016-10-16 ENCOUNTER — Emergency Department (HOSPITAL_COMMUNITY)
Admission: EM | Admit: 2016-10-16 | Discharge: 2016-10-17 | Disposition: A | Discharge status: Home / Self Care | Payer: Medicare Other | Attending: Emergency Medicine | Admitting: Emergency Medicine

## 2016-10-16 ENCOUNTER — Encounter (HOSPITAL_COMMUNITY): Payer: Self-pay | Admitting: Emergency Medicine

## 2016-10-16 ENCOUNTER — Emergency Department (HOSPITAL_COMMUNITY): Payer: Medicare Other

## 2016-10-16 DIAGNOSIS — Y999 Unspecified external cause status: Secondary | ICD-10-CM | POA: Insufficient documentation

## 2016-10-16 DIAGNOSIS — S8001XA Contusion of right knee, initial encounter: Secondary | ICD-10-CM | POA: Diagnosis not present

## 2016-10-16 DIAGNOSIS — Z96651 Presence of right artificial knee joint: Secondary | ICD-10-CM | POA: Insufficient documentation

## 2016-10-16 DIAGNOSIS — W01198A Fall on same level from slipping, tripping and stumbling with subsequent striking against other object, initial encounter: Secondary | ICD-10-CM | POA: Diagnosis not present

## 2016-10-16 DIAGNOSIS — W19XXXA Unspecified fall, initial encounter: Secondary | ICD-10-CM

## 2016-10-16 DIAGNOSIS — S8011XA Contusion of right lower leg, initial encounter: Secondary | ICD-10-CM | POA: Insufficient documentation

## 2016-10-16 DIAGNOSIS — Y929 Unspecified place or not applicable: Secondary | ICD-10-CM | POA: Insufficient documentation

## 2016-10-16 DIAGNOSIS — Y939 Activity, unspecified: Secondary | ICD-10-CM | POA: Diagnosis not present

## 2016-10-16 DIAGNOSIS — S8991XA Unspecified injury of right lower leg, initial encounter: Secondary | ICD-10-CM | POA: Diagnosis present

## 2016-10-16 NOTE — ED Provider Notes (Signed)
[facility] DEPT Provider Note   CSN: [id number] Arrival date & time: 10/16/16  2116     History   Chief Complaint Chief Complaint  Patient presents with  . Fall    HPI Gloria Mcdaniel is a 83 y.o. female.  HPI Patient with dementia. Level V caveat Patient presents after fall. At nursing home had reported mechanical fall when she tripped over her shoe. Had been complaining of pain in her right hip and right lower leg.Reportedly did not hit her head. She is at her baseline mild dementia. No loss of consciousness. No chest pain. No abdominal pain. No numbness or weakness.  Past Medical History:  Diagnosis Date  . Aftercare for healing traumatic fracture of other bone   . Elevated blood pressure reading without diagnosis of hypertension   . Genetic counseling    BRCA gnetic testing  . Memory loss   . Personal history of other diseases of respiratory system   . Personal history of traumatic fracture   . Personal history of traumatic fracture     There are no active problems to display for this patient.   Past Surgical History:  Procedure Laterality Date  . BREAST SURGERY Right    Radical Mastectomy 1973-Dr. Sherral Hammers  . TOTAL KNEE ARTHROPLASTY Right 11/04/2006  . VAGINAL HYSTERECTOMY      OB History    No data available       Home Medications    Prior to Admission medications   Medication Sig Start Date End Date Taking? Authorizing Provider  acetaminophen (TYLENOL) 325 MG tablet Take 325-650 mg by mouth 2 (two) times daily as needed for moderate pain.  01/22/14  Yes Historical Provider, MD  donepezil (ARICEPT) 10 MG tablet Take 1 tablet (10 mg total) by mouth at bedtime. 03/20/16  Yes Vikram R Penumalli, MD  latanoprost (XALATAN) 0.005 % ophthalmic solution Place 1 drop into both eyes at bedtime.  03/08/16  Yes Historical Provider, MD  levothyroxine (SYNTHROID, LEVOTHROID) 25 MCG tablet Take 25 mcg by mouth daily before breakfast.   Yes Historical Provider, MD    memantine (NAMENDA) 10 MG tablet Take 1 tablet (10 mg total) by mouth 2 (two) times daily. 03/20/16  Yes Penni Bombard, MD  sertraline (ZOLOFT) 25 MG tablet Take 1 tablet (25 mg total) by mouth daily. 03/20/16  Yes Penni Bombard, MD  tamoxifen (NOLVADEX) 20 MG tablet Take 20 mg by mouth daily.   Yes Historical Provider, MD    Family History Family History  Problem Relation Age of Onset  . Cancer Father   . Seizures Daughter   . Thyroid disease Daughter     Social History Social History  Substance Use Topics  . Smoking status: Never Smoker  . Smokeless tobacco: Never Used  . Alcohol use No     Allergies   Patient has no known allergies.   Review of Systems Review of Systems  Unable to perform ROS: Dementia     Physical Exam Updated Vital Signs BP 166/88 (BP Location: Left Arm)   Pulse 91   Temp 99.8 F (37.7 C) (Oral)   Resp 18   SpO2 96%   Physical Exam  Constitutional: She appears well-developed and well-nourished.  HENT:  Head: Atraumatic.  Eyes: EOM are normal.  Neck: Neck supple.  Cardiovascular: Normal rate.   Pulmonary/Chest: Effort normal. She exhibits no tenderness.  Abdominal: Soft. There is no tenderness.  Musculoskeletal: She exhibits tenderness.  Mild tenderness over right lower leg laterally. No  deformity. Neurovascular intact in foot. Good range of motion in bilateral hips and bilateral knees. Has had previous right knee replacement and left hip surgery. Tenderness over pubic symphysis area. Stable pelvis. No tenderness over posterior pelvis or lumbar spine.  Neurological: She is alert.  Skin: Skin is warm. Capillary refill takes less than 2 seconds.  Psychiatric: She has a normal mood and affect.     ED Treatments / Results  Labs (all labs ordered are listed, but only abnormal results are displayed) Labs Reviewed - No data to display  EKG  EKG Interpretation None       Radiology Dg Pelvis 1-2 Views  Result Date:  10/16/2016 CLINICAL DATA:  83 year old female with fall and low back and bilateral leg pain. EXAM: PELVIS - 1-2 VIEW COMPARISON:  None. FINDINGS: There is no acute fracture or dislocation. An intramedullary rod and dynamic screw noted transfixing an old healed left femoral neck fracture. There is heterotopic bone formation adjacent to the left femoral neck and greater trochanter. The bones are osteopenic. The soft tissues appear unremarkable. IMPRESSION: No acute fracture or dislocation. Osteopenia. Electronically Signed   By: Anner Crete M.D.   On: 10/16/2016 23:22   Dg Tibia/fibula Right  Result Date: 10/16/2016 CLINICAL DATA:  Status post fall, with right lower leg pain. Initial encounter. EXAM: RIGHT TIBIA AND FIBULA - 2 VIEW COMPARISON:  None. FINDINGS: There is no evidence of fracture or dislocation. The tibia and fibula appear grossly intact. The knee arthroplasty is grossly unremarkable in appearance. There is no evidence of loosening. Mild soft tissue swelling is noted about the anterior aspect of the ankle. IMPRESSION: No evidence of fracture or dislocation. Right knee arthroplasty is grossly unremarkable. Electronically Signed   By: Garald Balding M.D.   On: 10/16/2016 23:23    Procedures Procedures (including critical care time)  Medications Ordered in ED Medications - No data to display   Initial Impression / Assessment and Plan / ED Course  I have reviewed the triage vital signs and the nursing notes.  Pertinent labs & imaging results that were available during my care of the patient were reviewed by me and considered in my medical decision making (see chart for details).       Patient with fall. Reassuring exam and imaging. No fracture seen. Doubt intracranial injury. Will discharge home.  Final Clinical Impressions(s) / ED Diagnoses   Final diagnoses:  Fall, initial encounter  Contusion of right knee and lower leg, initial encounter    New Prescriptions New  Prescriptions   No medications on file     Davonna Belling, MD 10/17/16 0003

## 2016-10-16 NOTE — ED Notes (Signed)
Bed: WA07 Expected date:  Expected time:  Means of arrival:  Comments: 83 yo F  Fall

## 2016-10-16 NOTE — ED Triage Notes (Signed)
Pt from [facility] reported to have fallen stroking head against door. Skin tear Right forearm and reported by daughter to have struck back of head after tripping over daughters shoe. Pt c/o low back and bilat leg pain. VS at scene BP 171/94 Hr. 95 resp 16 CBG 114

## 2017-03-11 ENCOUNTER — Telehealth: Payer: Self-pay | Admitting: Diagnostic Neuroimaging

## 2017-03-11 ENCOUNTER — Encounter: Payer: Self-pay | Admitting: *Deleted

## 2017-03-11 NOTE — Telephone Encounter (Addendum)
Spoke with daughter, Gloria Mcdaniel and informed her Dr Marjory Liesenumalli is out of the office until next Monday. She stated she went to Social Security to make changes on her mother's record. SS told her they need a letter on [employer] letterhead that simply states patient has moderate dementia and lives in a memory care facility. It should state that is the reason the  [facility] or with her daughter, [name]. Gloria Mcdaniel stated she is needing to make changes to her mother's SS information. SS is not allowing her unless she presents the letter.  Gloria Mcdaniel is requesting the letter today. Advised her would discuss with work in dr, but could not promise the letter would be done. Trevor Ihadvised [name] could come into office with POA papers and get a copy of last office note from Dr Marjory LiesPenumalli. Gloria Mcdaniel still requested a letter. Advised her this RN will call her when work in Dr has given a reply to her request. Gloria Mcdaniel verbalized understanding, appreciation.

## 2017-03-11 NOTE — Telephone Encounter (Signed)
Pt daughter calling in asking for a letter that states her mothers condition for Social services, there is not a DPR on file but there is an entry from 03-20-16 stating daughter is now over pt's finances.  Pt daughter asking for a call back re: this note needed for social services

## 2017-03-11 NOTE — Telephone Encounter (Signed)
Letter written, signed, put at front desk. Called daughter and advised her the letter she requested is ready for pick up. She verbalized understanding, appreciation.

## 2017-03-11 NOTE — Telephone Encounter (Signed)
Chart reviewed, last office visit was with Dr. Marjory LiesPenumalli in June 2017, significant memory loss Mini-Mental Status Examination is only 9 out of 30, also suffered left occipital stroke,  It is okay to generate a letter

## 2017-03-19 ENCOUNTER — Ambulatory Visit: Payer: Medicare Other | Admitting: Diagnostic Neuroimaging

## 2017-03-20 IMAGING — CR DG PELVIS 1-2V
1 series · 1 of 1 positions shown · non-contrast
Comparison: None.

CLINICAL DATA: 89-year-old female with fall and low back and
bilateral leg pain.

EXAM:
PELVIS - 1-2 VIEW

[x pelvis]
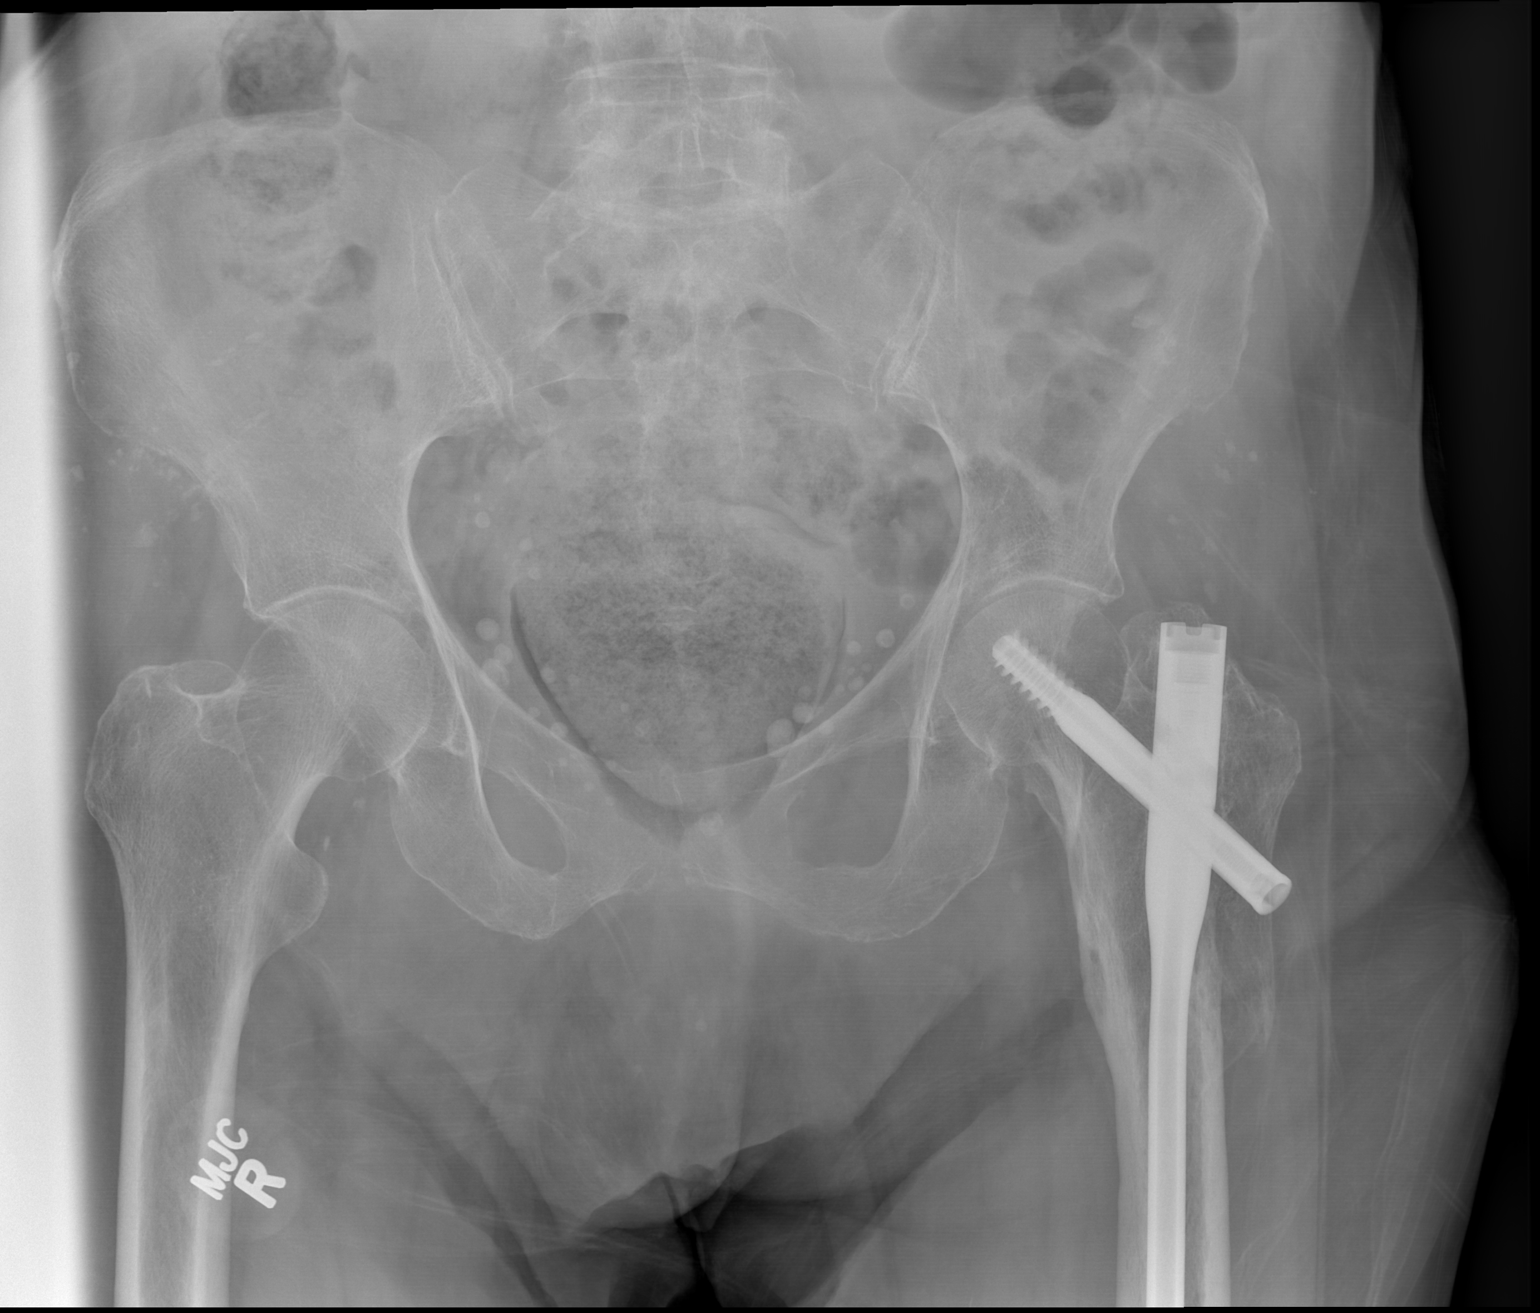

[1 of 1 positions shown; findings below may reference images not displayed]

FINDINGS: There is no acute fracture or dislocation. An intramedullary rod and
dynamic screw noted transfixing an old healed left femoral neck
fracture. There is heterotopic bone formation adjacent to the left
femoral neck and greater trochanter. The bones are osteopenic. The
soft tissues appear unremarkable.
IMPRESSION: No acute fracture or dislocation.

Osteopenia.

## 2017-04-01 ENCOUNTER — Telehealth: Payer: Self-pay | Admitting: Diagnostic Neuroimaging

## 2017-04-01 ENCOUNTER — Ambulatory Visit: Payer: PRIVATE HEALTH INSURANCE | Admitting: Diagnostic Neuroimaging

## 2017-04-01 NOTE — Telephone Encounter (Signed)
Daughter was appreciative of information as per below.  Will see about facility MD prescribing medication.

## 2017-04-01 NOTE — Telephone Encounter (Signed)
Patients daughter Earley AbideHilda called office in reference to canceling patients appointment today.  She states patient is at an [facility] facility. Earley AbideHilda would like to know if she needs to bring patient back due to patient having no mobility at all, patient can't remember anything, and would not be able to answer any questions.  Hildas concern is that Dr. Marjory LiesPenumalli prescribes Namenda and Aricept, but she is unable to get patient here.  Earley AbideHilda would like to speak with Dr. Marjory LiesPenumalli.

## 2017-04-01 NOTE — Telephone Encounter (Signed)
I called and spoke to daughter.  Pt is now living [facility] ([facility] for the last 2 months.  She has progressively gotten worse, not recognizing family members.  I relayed that per last note if pcp would refill namenda and donezepil no need to f/u here.  Her question was since worsening dementia did her mother need to continue to take medications and since worse need to come in?  They have MD on staff for acute illness, she thought but would check on this.  They prescribe?  Please advise.

## 2017-04-01 NOTE — Telephone Encounter (Signed)
Would defer to memory care unit physicians. Ok to continue for now. -VRP

## 2017-06-27 NOTE — Telephone Encounter (Signed)
ERROR

## 2019-06-24 DEATH — deceased

## 2019-08-11 ENCOUNTER — Telehealth: Payer: Self-pay | Admitting: Diagnostic Neuroimaging

## 2019-08-11 NOTE — Telephone Encounter (Signed)
Pts daughter informed office that pt has passed as of 2019/07/01 [facility]

## 2019-08-11 NOTE — Telephone Encounter (Signed)
Noted
